# Patient Record
Sex: Female | Born: 2005
Health system: Southern US, Community
[De-identification: ages and names within clinical notes are randomized; demographics above are authoritative.]

## PROBLEM LIST (undated history)

## (undated) DIAGNOSIS — T83511A Infection and inflammatory reaction due to indwelling urethral catheter, initial encounter: Secondary | ICD-10-CM

## (undated) DIAGNOSIS — N390 Urinary tract infection, site not specified: Secondary | ICD-10-CM

## (undated) HISTORY — DX: Infection and inflammatory reaction due to indwelling urethral catheter, initial encounter: T83.511A

## (undated) HISTORY — DX: Urinary tract infection, site not specified: N39.0

---
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
## 2023-02-25 ENCOUNTER — Ambulatory Visit: Payer: 59 | Admitting: Pediatrics

## 2023-02-25 ENCOUNTER — Encounter: Payer: Self-pay | Admitting: Pediatrics

## 2023-02-25 DIAGNOSIS — Z23 Encounter for immunization: Secondary | ICD-10-CM | POA: Diagnosis not present

## 2023-02-25 NOTE — Progress Notes (Signed)
MenB vaccine per orders. Indications, contraindications and side effects of vaccine/vaccines discussed with parent and parent verbally expressed understanding and also agreed with the administration of vaccine/vaccines as ordered above today.Handout (VIS) given for each vaccine at this visit.  

## 2023-02-25 NOTE — Patient Instructions (Signed)
Have a great freshman year!

## 2023-04-13 ENCOUNTER — Encounter: Payer: Self-pay | Admitting: Pediatrics
